# Patient Record
Sex: Male | Born: 1995 | Race: White | Hispanic: No | Marital: Single | Smoking: Never smoker
Health system: Southern US, Community
[De-identification: ages and names within clinical notes are randomized; demographics above are authoritative.]

## PROBLEM LIST (undated history)

## (undated) DIAGNOSIS — Z789 Other specified health status: Secondary | ICD-10-CM

## (undated) HISTORY — PX: ANTERIOR CRUCIATE LIGAMENT REPAIR: SHX115

---
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
## 2015-08-28 DIAGNOSIS — L254 Unspecified contact dermatitis due to food in contact with skin: Secondary | ICD-10-CM | POA: Diagnosis not present

## 2015-08-28 DIAGNOSIS — R0982 Postnasal drip: Secondary | ICD-10-CM | POA: Diagnosis not present

## 2016-03-10 ENCOUNTER — Ambulatory Visit (INDEPENDENT_AMBULATORY_CARE_PROVIDER_SITE_OTHER): Payer: BLUE CROSS/BLUE SHIELD | Admitting: Family Medicine

## 2016-03-10 ENCOUNTER — Encounter: Payer: Self-pay | Admitting: Family Medicine

## 2016-03-10 VITALS — BP 123/73 | HR 62 | Temp 98.0°F | Resp 16

## 2016-03-10 DIAGNOSIS — L089 Local infection of the skin and subcutaneous tissue, unspecified: Secondary | ICD-10-CM

## 2016-03-10 DIAGNOSIS — L309 Dermatitis, unspecified: Secondary | ICD-10-CM

## 2016-03-10 DIAGNOSIS — B360 Pityriasis versicolor: Secondary | ICD-10-CM | POA: Diagnosis not present

## 2016-03-10 MED ORDER — SULFAMETHOXAZOLE-TRIMETHOPRIM 800-160 MG PO TABS: 1.0000 | ORAL_TABLET | Freq: Two times a day (BID) | ORAL | Status: DC

## 2016-03-10 MED ORDER — PREDNISONE 20 MG PO TABS: ORAL_TABLET | ORAL | Status: DC

## 2016-03-10 NOTE — Progress Notes (Signed)
Patient ID: Tony Bowman, male   DOB: 1996-02-10, 20 y.o.   MRN: [id number]  Patient presents today with symptoms of itchy rash on his abdomen. Patient states that he has had this rash for the last 2 weeks or so. He states the area is raised and has not been draining anything. He does admit to doing some yard work a few weeks ago. He denies any fever or chills. He also states that he has had a discolored rash on his upper back for a while. He denies any symptoms with this rash. He also admits to a pimple-like rash on his left lip area. He does have facial hair and does shave over the area. The area hurts at times but is not healing. He denies any known nutritional deficiencies.  Review systems negative except mentioned above. Vitals as per Epic  GENERAL: NAD HEENT: no pharyngeal erythema, no exudate, no erythema of TMs, no cervical LAD RESP: CTA B CARD: RRR SKIN: Hypopigmented rash noted on the upper back, there is a small pimple dry lesion noted along the edge of the left lips, no drainage from the site, there is also a slightly raised erythematous rash on the abdomen primarily on the right side, there is no drainage from the site NEURO: CN II-XII grossly intact   A/P: 1) Tinea Versicolor- [facility] with patient [facility], follow up with me if symptoms persist or worsen 2) Skin Lesion Lip- we'll treat this like an infection with Bactrim DS orally, advised patient to keep the area moist with Vaseline or Aquaphor, I've advised him not to shave over the area until the symptoms are resolved, follow-up if needed 3) Skin Rash Abdomen- given the fact that the rash is itchy and has been there for 2 weeks will treat with oral steroids, oral antihistamine when necessary, seek medical attention if symptoms persist or worsen as discussed.

## 2016-04-29 ENCOUNTER — Ambulatory Visit
Admission: RE | Admit: 2016-04-29 | Discharge: 2016-04-29 | Disposition: A | Discharge status: Home / Self Care | Payer: BLUE CROSS/BLUE SHIELD | Source: Ambulatory Visit | Attending: Family Medicine | Admitting: Family Medicine

## 2016-04-29 ENCOUNTER — Other Ambulatory Visit: Payer: Self-pay | Admitting: Family Medicine

## 2016-04-29 DIAGNOSIS — R609 Edema, unspecified: Secondary | ICD-10-CM

## 2016-04-29 DIAGNOSIS — M25561 Pain in right knee: Secondary | ICD-10-CM | POA: Diagnosis not present

## 2016-04-29 DIAGNOSIS — R52 Pain, unspecified: Secondary | ICD-10-CM

## 2016-04-29 DIAGNOSIS — M25461 Effusion, right knee: Secondary | ICD-10-CM | POA: Diagnosis not present

## 2016-04-29 DIAGNOSIS — S8991XA Unspecified injury of right lower leg, initial encounter: Secondary | ICD-10-CM

## 2016-04-30 ENCOUNTER — Ambulatory Visit
Admission: RE | Admit: 2016-04-30 | Discharge: 2016-04-30 | Disposition: A | Discharge status: Home / Self Care | Payer: BLUE CROSS/BLUE SHIELD | Source: Ambulatory Visit | Attending: Family Medicine | Admitting: Family Medicine

## 2016-04-30 DIAGNOSIS — X58XXXA Exposure to other specified factors, initial encounter: Secondary | ICD-10-CM | POA: Diagnosis not present

## 2016-04-30 DIAGNOSIS — S83511A Sprain of anterior cruciate ligament of right knee, initial encounter: Secondary | ICD-10-CM | POA: Diagnosis not present

## 2016-04-30 DIAGNOSIS — S8991XA Unspecified injury of right lower leg, initial encounter: Secondary | ICD-10-CM | POA: Diagnosis present

## 2016-04-30 DIAGNOSIS — M25461 Effusion, right knee: Secondary | ICD-10-CM | POA: Diagnosis not present

## 2016-04-30 DIAGNOSIS — S83281A Other tear of lateral meniscus, current injury, right knee, initial encounter: Secondary | ICD-10-CM | POA: Insufficient documentation

## 2016-04-30 DIAGNOSIS — S8001XA Contusion of right knee, initial encounter: Secondary | ICD-10-CM | POA: Diagnosis not present

## 2016-07-10 ENCOUNTER — Ambulatory Visit (INDEPENDENT_AMBULATORY_CARE_PROVIDER_SITE_OTHER): Payer: BLUE CROSS/BLUE SHIELD | Admitting: Family Medicine

## 2016-07-10 ENCOUNTER — Encounter: Payer: Self-pay | Admitting: Family Medicine

## 2016-07-10 VITALS — Temp 96.6°F

## 2016-07-10 DIAGNOSIS — Z8719 Personal history of other diseases of the digestive system: Secondary | ICD-10-CM

## 2016-07-10 DIAGNOSIS — K529 Noninfective gastroenteritis and colitis, unspecified: Secondary | ICD-10-CM

## 2016-07-10 MED ORDER — ONDANSETRON 4 MG PO TBDP: 4.0000 mg | ORAL_TABLET | Freq: Three times a day (TID) | ORAL | 0 refills | Status: DC | PRN

## 2016-07-10 NOTE — Progress Notes (Signed)
Patient presents today with symptoms of nausea, vomiting, diarrhea which started around 4 AM this morning. Patient states that he had pizza last night before going right to bed and then woke up and drank some old coffee that had been sitting out for about one day. The cough he had milk and cream in it. He denies any bloody diarrhea, fever. He states that he usually has symptoms of reflux but has never seen a doctor for it. He describes the feeling in his stomach as intermittent tightness. He denies any urinary symptoms. He denies taking any new medications or increased doses of ibuprofen. He does describe some postnasal drip which makes him feel nauseous. Patient also states that he has stomach sensitivity to certain foods that are dairy in nature (cheese, yogurt, etc) but does not have problems with milk. He describes occasionally vomiting with certain foods and also loose bowel movements with them. He does enjoy spicy food and does drink significant caffeinated beverages throughout the day. He oftentimes eats and goes right to bed as well.  ROS: Negative except mentioned above.  Vitals as per Epic.  GENERAL: NAD HEENT: no pharyngeal erythema, no exudate, mild clear mucus noted noted in the posterior pharynx, no erythema of TMs, no cervical LAD RESP: CTA B CARD: RRR ABD: +BS, NT, no rebound or guarding appreciated NEURO: CN II-XII grossly intact   A/P: Gastroenteritis - BRAT diet, rest, hydration, Pepcid or Zantac when necessary, Claritin when necessary for postnasal drip, Zofran when necessary for nausea, patient will seek medical attention if any worsening symptoms, discussed with patient foods and beverages that can cause GERD as well. I have recommended that he follow-up with GI over break given his chronic symptoms.

## 2016-10-09 ENCOUNTER — Encounter: Payer: Self-pay | Admitting: Family Medicine

## 2016-10-09 ENCOUNTER — Ambulatory Visit (INDEPENDENT_AMBULATORY_CARE_PROVIDER_SITE_OTHER): Payer: BLUE CROSS/BLUE SHIELD | Admitting: Family Medicine

## 2016-10-09 VITALS — BP 133/91 | HR 88 | Temp 98.4°F | Resp 14

## 2016-10-09 DIAGNOSIS — J069 Acute upper respiratory infection, unspecified: Secondary | ICD-10-CM

## 2016-10-09 MED ORDER — AZITHROMYCIN 250 MG PO TABS: ORAL_TABLET | ORAL | 0 refills | Status: DC

## 2016-10-09 NOTE — Progress Notes (Signed)
Patient presents today with symptoms of sore throat in the mornings, nasal drainage, cough, headache. Patient denies any abdominal pain, nausea, vomiting, chest pain or shortness of breath. He denies any fever, myalgias, night sweats. Has not taken any medications today.  ROS: Negative except mentioned above. Vitals as per Epic. GENERAL: NAD HEENT: mild pharyngeal erythema, no exudate, no erythema of TMs, no cervical LAD RESP: CTA B CARD: RRR NEURO: CN II-XII grossly intact   A/P: URI - not suggestive of the flu given vitals and symptoms, will treat with Z-Pak, Claritin when necessary, Delsym when necessary, Tylenol/Ibuprofen when necessary, rest, hydration, seek medical attention if symptoms persist or worsen as discussed. No athletic activity or class if febrile.

## 2017-03-13 ENCOUNTER — Encounter: Payer: Self-pay | Admitting: Family Medicine

## 2017-03-13 ENCOUNTER — Ambulatory Visit (INDEPENDENT_AMBULATORY_CARE_PROVIDER_SITE_OTHER): Payer: Self-pay | Admitting: Family Medicine

## 2017-03-13 VITALS — BP 122/77 | HR 68 | Temp 97.7°F | Resp 14

## 2017-03-13 DIAGNOSIS — K219 Gastro-esophageal reflux disease without esophagitis: Secondary | ICD-10-CM

## 2017-03-13 MED ORDER — OMEPRAZOLE 40 MG PO CPDR
40.0000 mg | DELAYED_RELEASE_CAPSULE | Freq: Every day | ORAL | 0 refills | Status: DC
Start: 2017-03-13 — End: 2017-04-11

## 2017-03-13 NOTE — Progress Notes (Signed)
Patient presents today with chronic symptoms of epigastric discomfort, excessive burping, heartburn sensation. Patient states that he has tried Tums and Gas-X in the past which has helped some. He also has tried changing his diet which has also helped some. His symptoms primarily worse in the morning when he does not eat anything and does physical activity or goes to class fasting. He denies any problems with constipation or diarrhea. He denies any significant weight loss. He denies any bright red blood per rectum or melena. He denies any excessive alcohol use or cigarette smoking. He tends to have 2-3 hours between his last meal and going to sleep. He does not restrict anything particular in his diet except for whole milk. He tends to eat white bread instead of weeks. He is not a vegetarian. He does not have excessive amounts of caffeine. He denies taking any medications on a regular basis or supplements.  ROS: Negative except mentioned above.  Vitals as per Epic. GENERAL: NAD HEENT: no pharyngeal erythema, no exudate, no erythema of TMs, no cervical LAD RESP: CTA B CARD: RRR ABD: Positive bowel sounds, nontender, no organomegaly appreciated, no flank tenderness NEURO: CN II-XII grossly intact   A/P: Gastroesophageal reflux, possible ulcer - will treat with omeprazole for 2 weeks, discussed with patient to try to eat meals earlier and to avoid greasy, spicy foods at night and also to avoid caffeine and alcohol at night before bed. Also encourage patient not to skip meals in the morning. Drink plenty of water. Seek medical attention if symptoms persist or worsen. Would recommend evaluation by GI if no significant improvement with omeprazole and lifestyle changes.

## 2017-04-11 ENCOUNTER — Emergency Department
Admission: EM | Admit: 2017-04-11 | Discharge: 2017-04-11 | Disposition: A | Discharge status: Home / Self Care | Payer: 59 | Attending: Emergency Medicine | Admitting: Emergency Medicine

## 2017-04-11 ENCOUNTER — Emergency Department: Payer: 59

## 2017-04-11 ENCOUNTER — Encounter: Payer: Self-pay | Admitting: Emergency Medicine

## 2017-04-11 DIAGNOSIS — S022XXA Fracture of nasal bones, initial encounter for closed fracture: Secondary | ICD-10-CM | POA: Insufficient documentation

## 2017-04-11 DIAGNOSIS — W501XXA Accidental kick by another person, initial encounter: Secondary | ICD-10-CM | POA: Insufficient documentation

## 2017-04-11 DIAGNOSIS — Y92322 Soccer field as the place of occurrence of the external cause: Secondary | ICD-10-CM | POA: Diagnosis not present

## 2017-04-11 DIAGNOSIS — S0992XA Unspecified injury of nose, initial encounter: Secondary | ICD-10-CM | POA: Diagnosis present

## 2017-04-11 DIAGNOSIS — Y9366 Activity, soccer: Secondary | ICD-10-CM | POA: Insufficient documentation

## 2017-04-11 DIAGNOSIS — Y999 Unspecified external cause status: Secondary | ICD-10-CM | POA: Insufficient documentation

## 2017-04-11 MED ORDER — ACETAMINOPHEN-CODEINE #2 300-15 MG PO TABS: 1.0000 | ORAL_TABLET | ORAL | 0 refills | Status: AC | PRN

## 2017-04-11 NOTE — Discharge Instructions (Signed)
Sleep with head elevated, sleeping in a recliner would be ideal. Do not lie or sleep flat.   Reduce activities that would increase your blood pressure in order to minimize swelling. Minimize standing and walking activities.  Do not participate with soccer activities until cleared by ENT.  You may take Tylenol for pain. Please avoid ibuprofen, Motrin, Aleve or Advil.  Dr. Elenore Rota would like for you to schedule an appointment to be seen Tuesday, Apr 14, 2017 at his office.

## 2017-04-11 NOTE — ED Triage Notes (Signed)
Pt reports injury during soccer game at [facility]; pt says he "took a shin" to the bridge of his nose; deviation to the right noted along with swelling and bruising; small laceration present; pt denies loss of consciousness; denies headache, denies any pain; pt ambulatory with steady gait

## 2017-04-11 NOTE — ED Notes (Signed)
Patient transported to CT 

## 2017-04-11 NOTE — ED Provider Notes (Signed)
[facility] Emergency Department Provider Note   ____________________________________________   I have reviewed the triage vital signs and the nursing notes.   HISTORY  Chief Complaint Facial Injury    HPI Tony Bowman is a 21 y.o. male presents to emergency department with nasal injury he sustained while playing soccer earlier this evening. 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 Patient noted epistaxis and swelling medially following the injury. Patient noted deformity of his nose swelling and bruising. Patient denies breathing difficulty or any other airway issues.He denies loss of consciousness and recalls the event Patient denies head, neck or back pain at this time. Patient denies fever, chills, headache, vision changes, chest pain, chest tightness, shortness of breath, abdominal pain, nausea and vomiting.  History reviewed. No pertinent past medical history.  There are no active problems to display for this patient.   Past Surgical History:  Procedure Laterality Date  . ANTERIOR CRUCIATE LIGAMENT REPAIR Right     Prior to Admission medications   Medication Sig Start Date End Date Taking? Authorizing Provider  acetaminophen-codeine (TYLENOL #2) 300-15 MG tablet Take 1 tablet by mouth every 4 (four) hours as needed for moderate pain. 04/11/17   Bunny Lowdermilk M, PA-C    Allergies Patient has no known allergies.  Family History  Problem Relation Age of Onset  . Hyperlipidemia Father   . Hypertension Father   . Heart disease Paternal Uncle   . Cancer Paternal Grandmother   . Cancer Paternal Grandfather     Social History Social History  Substance Use Topics  . Smoking status: Never Smoker  . Smokeless tobacco: Never Used  . Alcohol use Yes    Review of Systems Constitutional: Negative for fever/chills Eyes: No visual changes. ENT:  Negative for sore throat and for difficulty  swallowing Cardiovascular: Denies chest pain. Respiratory: Denies cough. Denies shortness of breath. Gastrointestinal: No abdominal pain.  No nausea, vomiting, diarrhea. Genitourinary: Negative for dysuria. Musculoskeletal: Positive for nasal pain with epistaxis, edema and  Deformity. Skin: Negative for rash. Neurological: Negative for headaches.  Negative focal weakness or numbness. Negative for loss of consciousness. Able to ambulate. ____________________________________________   PHYSICAL EXAM:  VITAL SIGNS: ED Triage Vitals  Enc Vitals Group     BP 04/11/17 2104 115/79     Pulse Rate 04/11/17 2104 83     Resp 04/11/17 2104 18     Temp 04/11/17 2104 98.2 F (36.8 C)     Temp Source 04/11/17 2104 Oral     SpO2 04/11/17 2104 99 %     Weight 04/11/17 2106 160 lb (72.6 kg)     Height 04/11/17 2106 5' 7.5" (1.715 m)     Head Circumference --      Peak Flow --      Pain Score 04/11/17 2104 0     Pain Loc --      Pain Edu? --      Excl. in GC? --     Constitutional: Alert and oriented. Well appearing and in no acute distress.  Eyes: Conjunctivae are normal. PERRL. EOMI  Head: Normocephalic and atraumatic. ENT:      Ears: Canals clear. TMs intact bilaterally.      Nose: Deformity with edema and ecchymosis. No epistaxis at the time of evaluation. Dried blood around the nares and small abrasion along the bridge of the nose.       Mouth/Throat: Mucous membranes are  moist. Oropharynx nonedematous. Neck:Supple. No thyromegaly. No stridor.  Cardiovascular: Normal rate, regular rhythm. Normal S1 and S2.  Good peripheral circulation. Respiratory: Normal respiratory effort without tachypnea or retractions. Lungs CTAB. No wheezes/rales/rhonchi. Good air entry to the bases with no decreased or absent breath sounds. Hematological/Lymphatic/Immunological: No cervical lymphadenopathy. Cardiovascular: Normal rate, regular rhythm. Normal distal pulses. Gastrointestinal: Bowel sounds 4  quadrants. Soft and nontender to palpation. No guarding or rigidity. No palpable masses. No distention. No CVA tenderness. Musculoskeletal: Nontender with normal range of motion in all extremities. Neurologic: Normal speech and language. No gross focal neurologic deficits are appreciated. No gait instability. Cranial nerves: II-X intact. No sensory loss or abnormal reflexes.  Skin:  Skin is warm, dry and intact. No rash noted. Psychiatric: Mood and affect are normal. Speech and behavior are normal. Patient exhibits appropriate insight and judgement.  ____________________________________________   LABS (all labs ordered are listed, but only abnormal results are displayed)  Labs Reviewed - No data to display ____________________________________________  EKG None ____________________________________________  RADIOLOGY CT max of facial without contrast ____________________________________________   PROCEDURES  Procedure(s) performed: no    Critical Care performed: no ____________________________________________   INITIAL IMPRESSION / ASSESSMENT AND PLAN / ED COURSE  Pertinent labs & imaging results that were available during my care of the patient were reviewed by me and considered in my medical decision making (see chart for details).  Patient presents to emergency department with nasal pain, epistaxis and deformity after being hit in the nose during a soccer game earlier tonight. Patient history, physical exam and imaging findings are consistent with acute slightly comminuted and displaced bilateral nasal bone. Dr. Elenore Rota with ENT was contacted regarding fracture and he advised patient will need a follow-up in his office on Tuesday. He also instructed patient to refrain from all exertional activities and soccer until cleared by ENT. Recommendations given to patient are as follows: Sleep with head elevated, no exertional activities to increase blood pressure, no participation with  soccer activities, apply cold packs to reduce swelling of the nose, manage pain with Tylenol and refrain from taking NSAIDs. Patient given prescription for Tylenol with Codeine for severe pain. Patient was advised to follow up with ENT for continued care and was also advised to return to the emergency department for symptoms that change or worsen.  ____________________________________________   FINAL CLINICAL IMPRESSION(S) / ED DIAGNOSES  Final diagnoses:  Closed fracture of nasal bone, initial encounter       NEW MEDICATIONS STARTED DURING THIS VISIT:  Discharge Medication List as of 04/11/2017 10:14 PM    START taking these medications   Details  acetaminophen-codeine (TYLENOL #2) 300-15 MG tablet Take 1 tablet by mouth every 4 (four) hours as needed for moderate pain., Starting Sat 04/11/2017, Print         Note:  This document was prepared using Dragon voice recognition software and may include unintentional dictation errors.    Clois Comber, PA-C 04/11/17 2359    Jene Every, MD 04/12/17 [phone]

## 2017-04-14 ENCOUNTER — Encounter: Payer: Self-pay | Admitting: *Deleted

## 2017-04-16 ENCOUNTER — Ambulatory Visit: Payer: 59 | Admitting: Anesthesiology

## 2017-04-16 ENCOUNTER — Encounter
Admission: RE | Disposition: A | Discharge status: Home / Self Care | Payer: Self-pay | Source: Ambulatory Visit | Attending: Otolaryngology

## 2017-04-16 ENCOUNTER — Ambulatory Visit
Admission: RE | Admit: 2017-04-16 | Discharge: 2017-04-16 | Disposition: A | Discharge status: Home / Self Care | Payer: 59 | Source: Ambulatory Visit | Attending: Otolaryngology | Admitting: Otolaryngology

## 2017-04-16 DIAGNOSIS — W500XXA Accidental hit or strike by another person, initial encounter: Secondary | ICD-10-CM | POA: Insufficient documentation

## 2017-04-16 DIAGNOSIS — S022XXA Fracture of nasal bones, initial encounter for closed fracture: Secondary | ICD-10-CM | POA: Diagnosis not present

## 2017-04-16 DIAGNOSIS — K219 Gastro-esophageal reflux disease without esophagitis: Secondary | ICD-10-CM | POA: Diagnosis not present

## 2017-04-16 HISTORY — DX: Other specified health status: Z78.9

## 2017-04-16 HISTORY — PX: CLOSED REDUCTION NASAL FRACTURE: SHX5365

## 2017-04-16 SURGERY — CLOSED REDUCTION, FRACTURE, NASAL BONE
Anesthesia: General

## 2017-04-16 MED ORDER — PROMETHAZINE HCL 25 MG/ML IJ SOLN: 6.2500 mg | INTRAMUSCULAR | Status: DC | PRN

## 2017-04-16 MED ORDER — LIDOCAINE HCL (CARDIAC) 20 MG/ML IV SOLN
INTRAVENOUS | Status: DC | PRN
  Administered 2017-04-16: 50 mg via INTRATRACHEAL

## 2017-04-16 MED ORDER — ACETAMINOPHEN 10 MG/ML IV SOLN
1000.0000 mg | Freq: Once | INTRAVENOUS | Status: AC
  Administered 2017-04-16: 1000 mg via INTRAVENOUS

## 2017-04-16 MED ORDER — LACTATED RINGERS IV SOLN
10.0000 mL/h | INTRAVENOUS | Status: DC
  Administered 2017-04-16: 10 mL/h via INTRAVENOUS

## 2017-04-16 MED ORDER — MIDAZOLAM HCL 5 MG/5ML IJ SOLN
INTRAMUSCULAR | Status: DC | PRN
  Administered 2017-04-16: 2 mg via INTRAVENOUS

## 2017-04-16 MED ORDER — FENTANYL CITRATE (PF) 100 MCG/2ML IJ SOLN
INTRAMUSCULAR | Status: DC | PRN
  Administered 2017-04-16: 100 ug via INTRAVENOUS

## 2017-04-16 MED ORDER — OXYCODONE HCL 5 MG PO TABS
5.0000 mg | ORAL_TABLET | Freq: Once | ORAL | Status: AC
  Administered 2017-04-16: 5 mg via ORAL

## 2017-04-16 MED ORDER — FENTANYL CITRATE (PF) 100 MCG/2ML IJ SOLN: 25.0000 ug | INTRAMUSCULAR | Status: DC | PRN

## 2017-04-16 MED ORDER — PHENYLEPHRINE HCL 0.5 % NA SOLN
NASAL | Status: DC | PRN
  Administered 2017-04-16: 10 mL via NASAL

## 2017-04-16 MED ORDER — PROPOFOL 10 MG/ML IV BOLUS
INTRAVENOUS | Status: DC | PRN
  Administered 2017-04-16: 50 mg via INTRAVENOUS
  Administered 2017-04-16: 100 mg via INTRAVENOUS

## 2017-04-16 MED ORDER — DEXAMETHASONE SODIUM PHOSPHATE 4 MG/ML IJ SOLN
INTRAMUSCULAR | Status: DC | PRN
  Administered 2017-04-16: 8 mg via INTRAVENOUS

## 2017-04-16 MED ORDER — MEPERIDINE HCL 25 MG/ML IJ SOLN: 6.2500 mg | INTRAMUSCULAR | Status: DC | PRN

## 2017-04-16 MED ORDER — ONDANSETRON HCL 4 MG/2ML IJ SOLN
INTRAMUSCULAR | Status: DC | PRN
  Administered 2017-04-16: 4 mg via INTRAVENOUS

## 2017-04-16 SURGICAL SUPPLY — 18 items
ADHESIVE MASTISOL STRL (MISCELLANEOUS) IMPLANT
BASIN GRAD PLASTIC 32OZ STRL (MISCELLANEOUS) ×3 IMPLANT
CANISTER SUCT 1200ML W/VALVE (MISCELLANEOUS) ×3 IMPLANT
CLOSURE WOUND 1/2 X4 (GAUZE/BANDAGES/DRESSINGS) ×1
COVER MAYO STAND STRL (DRAPES) ×3 IMPLANT
CUP MEDICINE 2OZ PLAST GRAD ST (MISCELLANEOUS) ×3 IMPLANT
GAUZE PETROLATUM 1 X8 (GAUZE/BANDAGES/DRESSINGS) IMPLANT
GAUZE SPONGE 4X4 12PLY STRL (GAUZE/BANDAGES/DRESSINGS) ×3 IMPLANT
GLOVE PI ULTRA LF STRL 7.5 (GLOVE) ×1 IMPLANT
GLOVE PI ULTRA NON LATEX 7.5 (GLOVE) ×2
KIT ROOM TURNOVER OR (KITS) ×3 IMPLANT
PAD ALCOHOL SWAB (MISCELLANEOUS) ×3 IMPLANT
SPONGE NEURO XRAY DETECT 1X3 (DISPOSABLE) ×3 IMPLANT
STRAP BODY AND KNEE 60X3 (MISCELLANEOUS) ×3 IMPLANT
STRIP CLOSURE SKIN 1/2X4 (GAUZE/BANDAGES/DRESSINGS) ×2 IMPLANT
TOWEL OR 17X26 4PK STRL BLUE (TOWEL DISPOSABLE) ×3 IMPLANT
TUBING CONN 6MMX3.1M (TUBING) ×2
TUBING SUCTION CONN 0.25 STRL (TUBING) ×1 IMPLANT

## 2017-04-16 NOTE — Anesthesia Preprocedure Evaluation (Addendum)
Anesthesia Evaluation  Patient identified by MRN, date of birth, ID band Patient awake    Reviewed: Allergy & Precautions, NPO status , Patient's Chart, lab work & pertinent test results  History of Anesthesia Complications Negative for: history of anesthetic complications  Airway Mallampati: I  TM Distance: >3 FB Neck ROM: Full    Dental  (+)    Pulmonary neg pulmonary ROS,    Pulmonary exam normal breath sounds clear to auscultation       Cardiovascular Exercise Tolerance: Good negative cardio ROS Normal cardiovascular exam Rhythm:Regular Rate:Normal     Neuro/Psych negative neurological ROS     GI/Hepatic GERD  ,  Endo/Other  negative endocrine ROS  Renal/GU negative Renal ROS     Musculoskeletal   Abdominal   Peds  Hematology negative hematology ROS (+)   Anesthesia Other Findings   Reproductive/Obstetrics                             Anesthesia Physical Anesthesia Plan  ASA: I  Anesthesia Plan: General   Post-op Pain Management:    Induction: Intravenous  PONV Risk Score and Plan: 1 and Ondansetron and Dexamethasone  Airway Management Planned: LMA  Additional Equipment:   Intra-op Plan:   Post-operative Plan: Extubation in OR  Informed Consent: I have reviewed the patients History and Physical, chart, labs and discussed the procedure including the risks, benefits and alternatives for the proposed anesthesia with the patient or authorized representative who has indicated his/her understanding and acceptance.     Plan Discussed with: CRNA  Anesthesia Plan Comments:        Anesthesia Quick Evaluation

## 2017-04-16 NOTE — Anesthesia Procedure Notes (Signed)
Procedure Name: LMA Insertion Date/Time: 04/16/2017 9:19 AM Performed by: Andee Poles Pre-anesthesia Checklist: Patient identified, Emergency Drugs available, Suction available, Timeout performed and Patient being monitored Patient Re-evaluated:Patient Re-evaluated prior to induction Oxygen Delivery Method: Circle system utilized Preoxygenation: Pre-oxygenation with 100% oxygen Induction Type: IV induction LMA: LMA inserted LMA Size: 4.0 Number of attempts: 1 Placement Confirmation: positive ETCO2 and breath sounds checked- equal and bilateral Tube secured with: Tape

## 2017-04-16 NOTE — Discharge Instructions (Signed)
General Anesthesia, Adult, Care After °These instructions provide you with information about caring for yourself after your procedure. Your health care provider may also give you more specific instructions. Your treatment has been planned according to current medical practices, but problems sometimes occur. Call your health care provider if you have any problems or questions after your procedure. °What can I expect after the procedure? °After the procedure, it is common to have: °· Vomiting. °· A sore throat. °· Mental slowness. ° °It is common to feel: °· Nauseous. °· Cold or shivery. °· Sleepy. °· Tired. °· Sore or achy, even in parts of your body where you did not have surgery. ° °Follow these instructions at home: °For at least 24 hours after the procedure: °· Do not: °? Participate in activities where you could fall or become injured. °? Drive. °? Use heavy machinery. °? Drink alcohol. °? Take sleeping pills or medicines that cause drowsiness. °? Make important decisions or sign legal documents. °? Take care of children on your own. °· Rest. °Eating and drinking °· If you vomit, drink water, juice, or soup when you can drink without vomiting. °· Drink enough fluid to keep your urine clear or pale yellow. °· Make sure you have little or no nausea before eating solid foods. °· Follow the diet recommended by your health care provider. °General instructions °· Have a responsible adult stay with you until you are awake and alert. °· Return to your normal activities as told by your health care provider. Ask your health care provider what activities are safe for you. °· Take over-the-counter and prescription medicines only as told by your health care provider. °· If you smoke, do not smoke without supervision. °· Keep all follow-up visits as told by your health care provider. This is important. °Contact a health care provider if: °· You continue to have nausea or vomiting at home, and medicines are not helpful. °· You  cannot drink fluids or start eating again. °· You cannot urinate after 8-12 hours. °· You develop a skin rash. °· You have fever. °· You have increasing redness at the site of your procedure. °Get help right away if: °· You have difficulty breathing. °· You have chest pain. °· You have unexpected bleeding. °· You feel that you are having a life-threatening or urgent problem. °This information is not intended to replace advice given to you by your health care provider. Make sure you discuss any questions you have with your health care provider. °Document Released: 11/17/2000 Document Revised: 01/14/2016 Document Reviewed: 07/26/2015 °Elsevier Interactive Patient Education © 2018 [employer] ° °

## 2017-04-16 NOTE — Anesthesia Postprocedure Evaluation (Signed)
Anesthesia Post Note  Patient: Tony Bowman  Procedure(s) Performed: Procedure(s) (LRB): CLOSED REDUCTION NASAL FRACTURE (N/A)  Patient location during evaluation: PACU Anesthesia Type: General Level of consciousness: awake and alert, oriented and patient cooperative Pain management: pain level controlled Vital Signs Assessment: post-procedure vital signs reviewed and stable Respiratory status: spontaneous breathing, nonlabored ventilation and respiratory function stable Cardiovascular status: blood pressure returned to baseline and stable Postop Assessment: adequate PO intake Anesthetic complications: no    Reed Breech

## 2017-04-16 NOTE — Transfer of Care (Signed)
Immediate Anesthesia Transfer of Care Note  Patient: Tony Bowman  Procedure(s) Performed: Procedure(s): CLOSED REDUCTION NASAL FRACTURE (N/A)  Patient Location: PACU  Anesthesia Type: General  Level of Consciousness: awake, alert  and patient cooperative  Airway and Oxygen Therapy: Patient Spontanous Breathing and Patient connected to supplemental oxygen  Post-op Assessment: Post-op Vital signs reviewed, Patient's Cardiovascular Status Stable, Respiratory Function Stable, Patent Airway and No signs of Nausea or vomiting  Post-op Vital Signs: Reviewed and stable  Complications: No apparent anesthesia complications

## 2017-04-16 NOTE — H&P (Signed)
H&P has been reviewedand patient reevaluated,  and no changes necessary. To be downloaded later.  

## 2017-04-16 NOTE — Op Note (Signed)
04/16/2017  9:37 AM    Tony Bowman  [id number]   Pre-Op Dx:  Displaced nasal fracture  Post-op Dx: Displaced nasal fracture  Proc: Closed reduction nasal fracture   Surg:  Lesli Issa H  Anes:  GOT  EBL:  None  Comp:  None  Findings:  Nasal bones pushed to the right side with trauma. His nose looks straight post reduction  Procedure: The patient was given general anesthesia by laryngeal mask anesthesia. His nose prepped using cottonoid pledgets soaked in 4% lidocaine mixed with Afrin. There was very obvious to see his nose deviated towards the right side and he could feel the depression of left nasal bone and outward fracture of the right nasal bone.  The cotton pledges were removed. A [place] elevator was placed inside the nostril to elevate the left nasal bone. Digital pressure was used right nasal bone to fracture it back into the midline. You could hear the crack as the bones moved over, and then the nose was sitting in the midline. Steri-Strips were placed over the nasal dorsum followed by an Aquaplast cast. He has small scab of the nasal dorsum that was covered over by Steri-Strips. There was no evidence for any infection.  Dispo:   To PACU to be discharged home  Plan:  To follow-up in the office in 1 week for cast and Steri-Strip removal. He is to rest at home and take it easy and not play any soccer for 2 weeks.  [place]  04/16/2017 9:37 AM

## 2017-04-17 ENCOUNTER — Encounter: Payer: Self-pay | Admitting: Otolaryngology

## 2017-05-18 IMAGING — CR DG KNEE COMPLETE 4+V*R*
1 series · 4 of 4 positions shown · non-contrast
Comparison: None.

CLINICAL DATA: Pain and swelling. Injury to right knee while
playing soccer.

EXAM:
RIGHT KNEE - COMPLETE 4+ VIEW

[Series 1: dg knee 4 v w/ sunrise/patella right · 0.14mm/px · 4 of 4 slices shown]
[im 1/4]
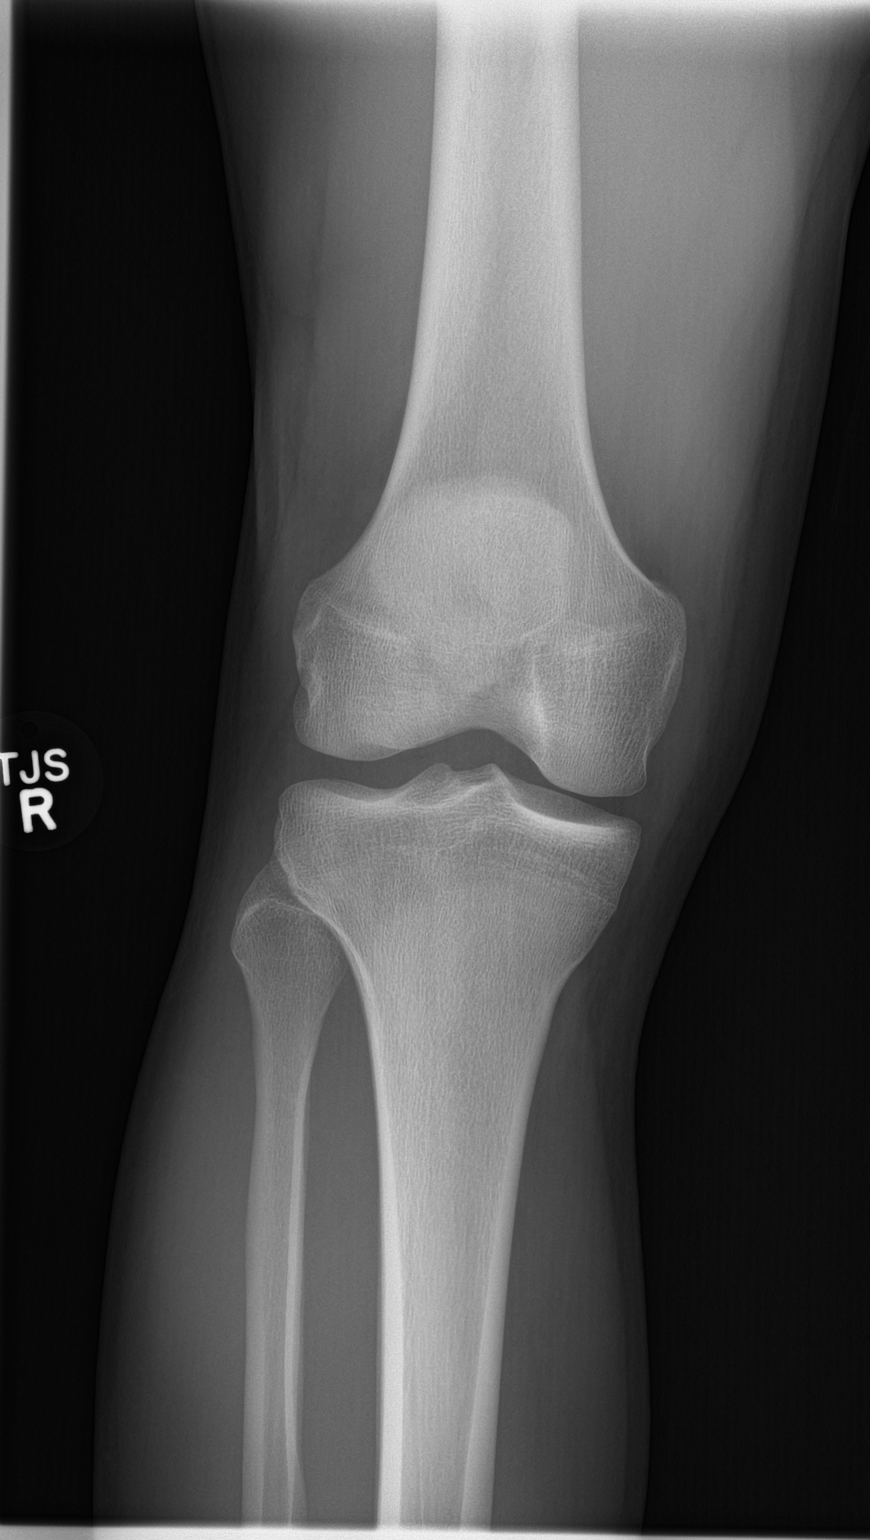
[im 2/4]
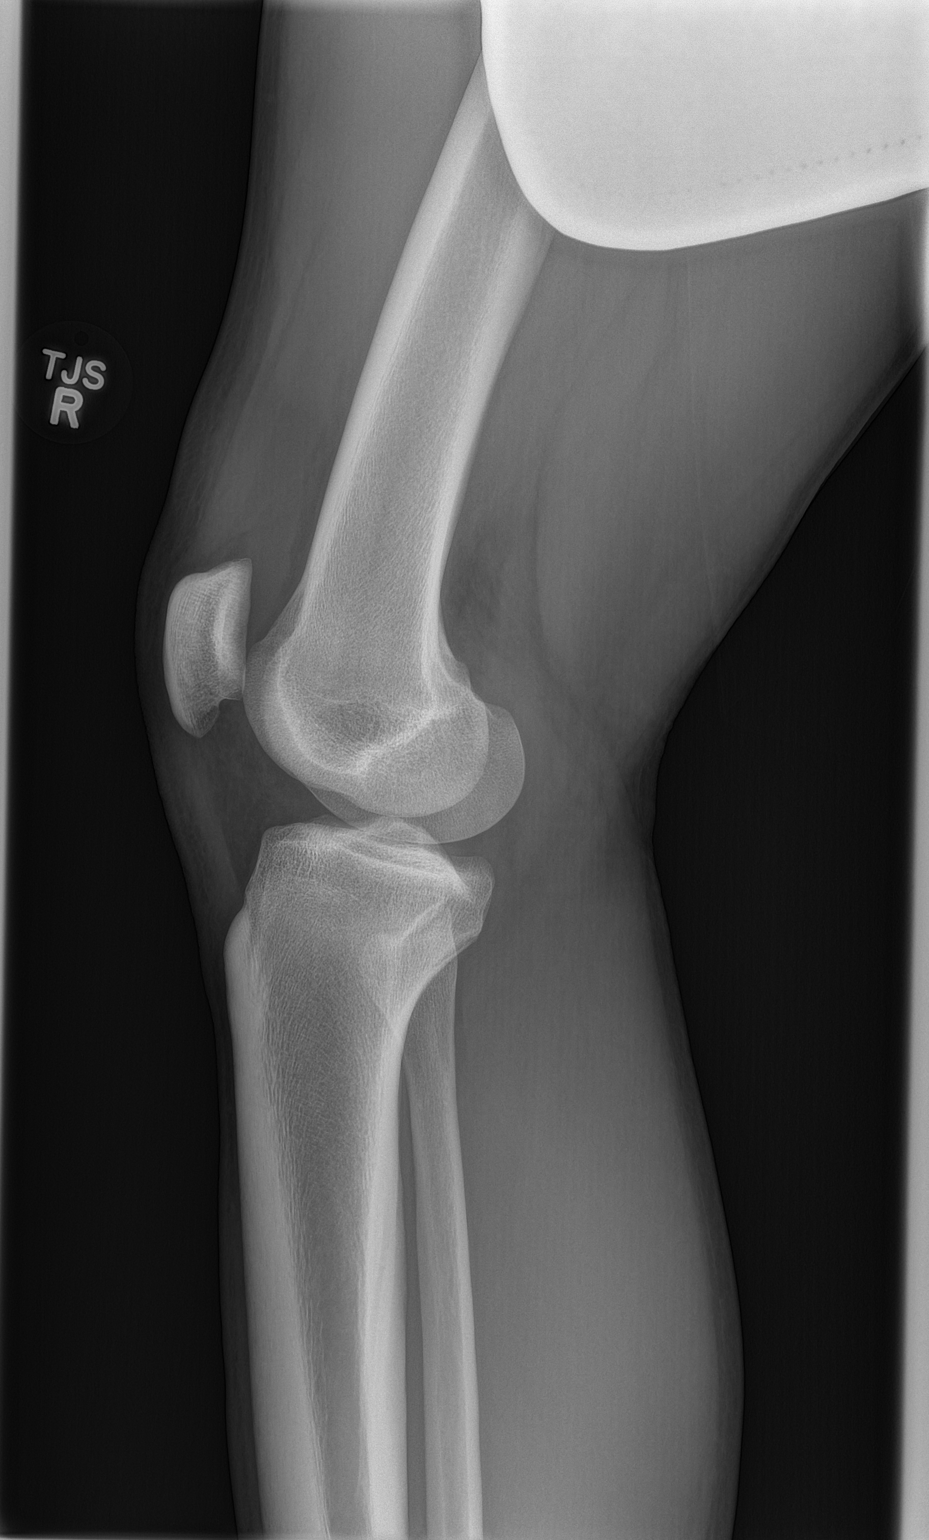
[im 3/4]
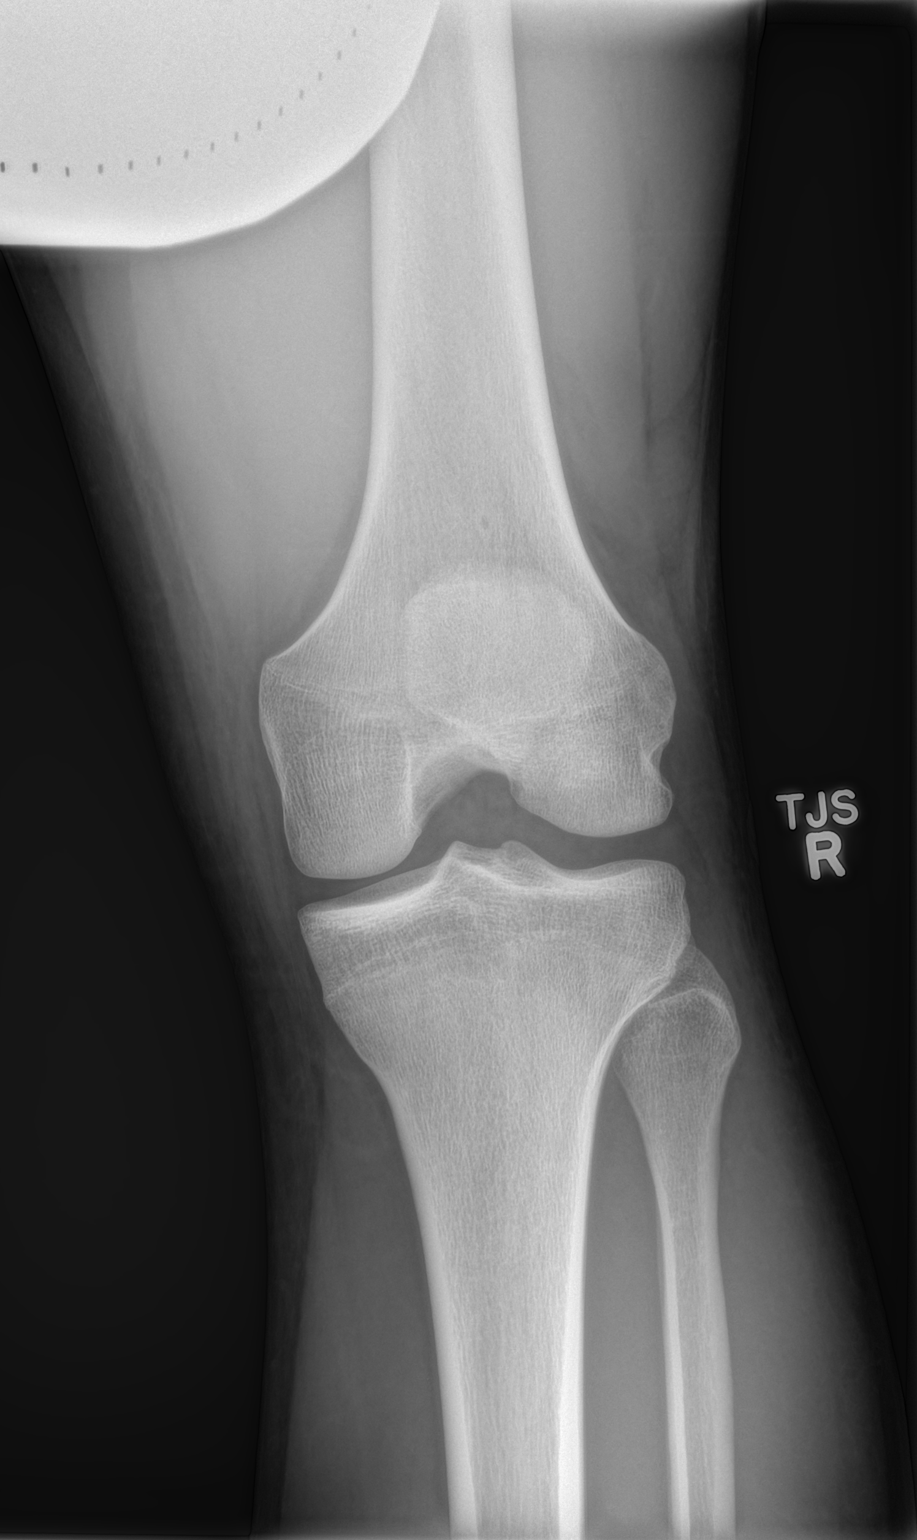
[im 4/4]
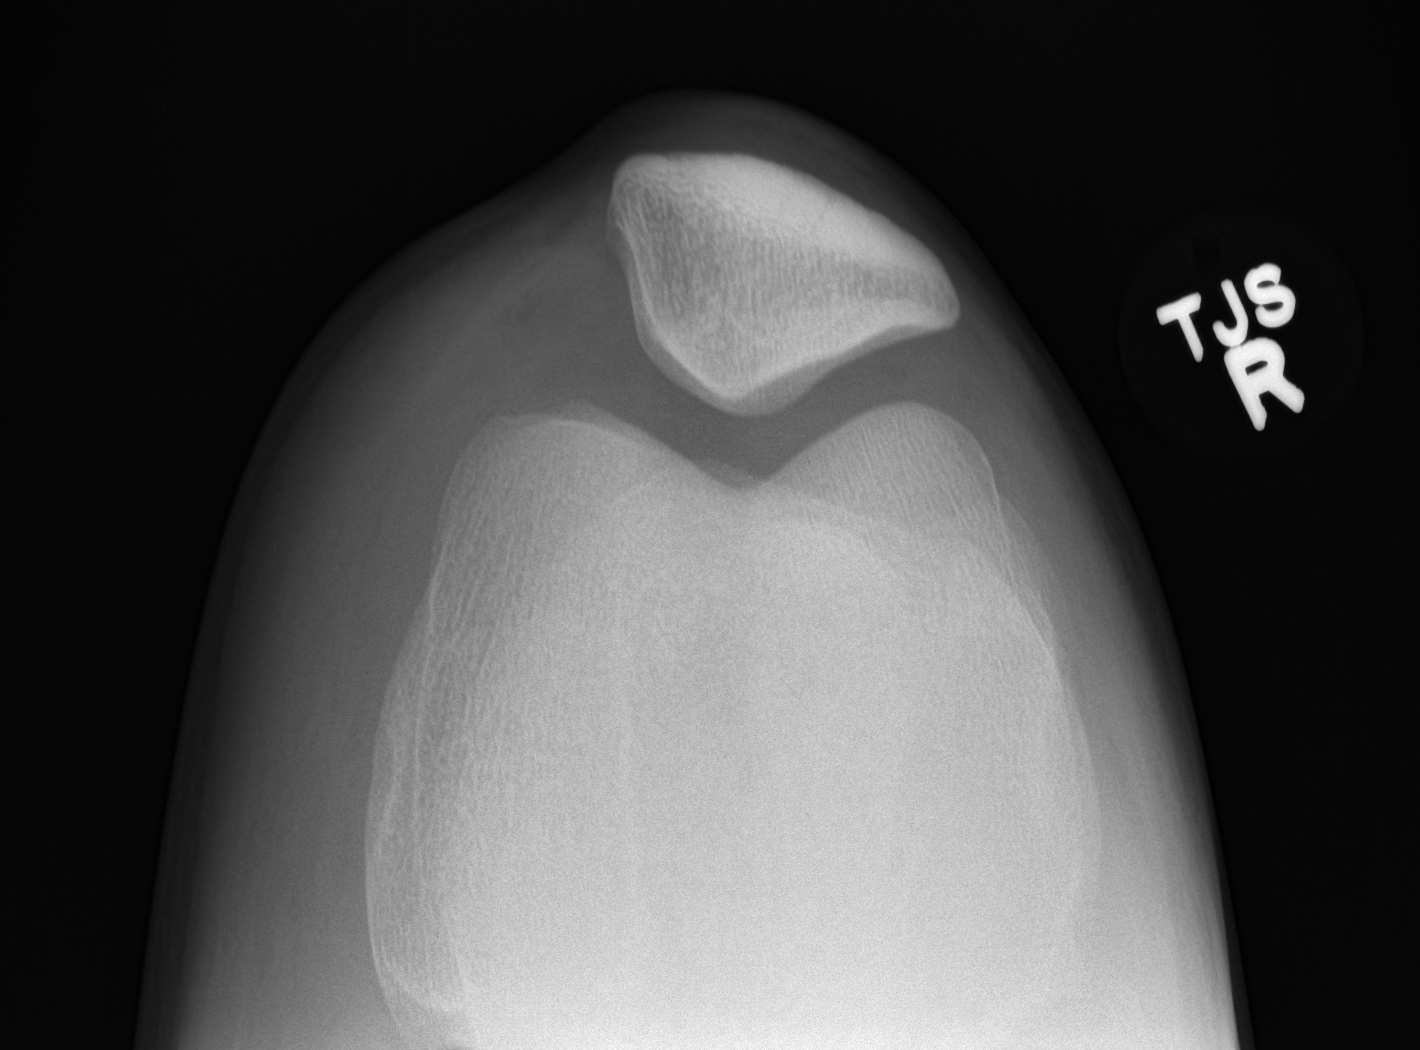

[4 of 4 positions shown; findings below may reference images not displayed]

FINDINGS: There is a moderate-sized suprapatellar joint effusion. The knee is
aligned. The patella is well-positioned, as demonstrated on the
sunrise view.

The bones are intact. No evidence of fracture. The joint spaces are
maintained, no degenerative changes apparent. No discrete soft
tissue swelling.
IMPRESSION: Suprapatellar joint effusion.

No acute bony abnormality.

## 2018-04-29 ENCOUNTER — Encounter: Payer: Self-pay | Admitting: Family Medicine

## 2018-04-29 ENCOUNTER — Ambulatory Visit (INDEPENDENT_AMBULATORY_CARE_PROVIDER_SITE_OTHER): Payer: 59 | Admitting: Family Medicine

## 2018-04-29 VITALS — BP 127/84 | HR 76 | Temp 97.1°F | Resp 14

## 2018-04-29 DIAGNOSIS — R0981 Nasal congestion: Secondary | ICD-10-CM

## 2018-04-30 IMAGING — CT CT MAXILLOFACIAL W/O CM
3 series · 16 of 47 positions shown, 19 images · non-contrast
Comparison: None.

CLINICAL DATA: Nasal trauma, shin to the nose with deviation and
swelling

EXAM:
CT MAXILLOFACIAL WITHOUT CONTRAST
TECHNIQUE: Multidetector CT imaging of the maxillofacial structures was
performed. Multiplanar CT image reconstructions were also generated.

[Series 2: max soft · axial · 0.33mm/px · z∈[+174,+312]mm · 10 of 81 slices shown, 13 images]
[im 6/81  brain]
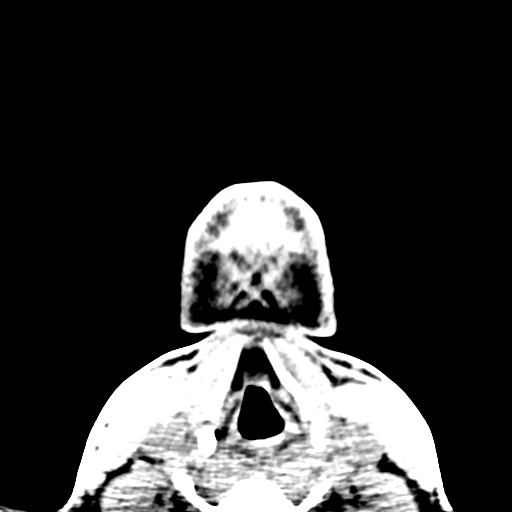
[im 6/81  bone]
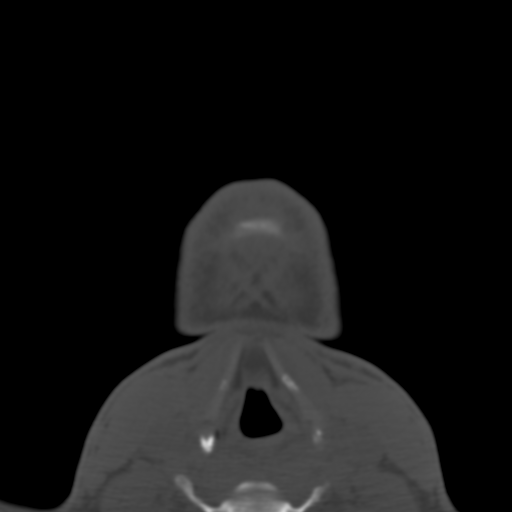
[im 14/81  bone]
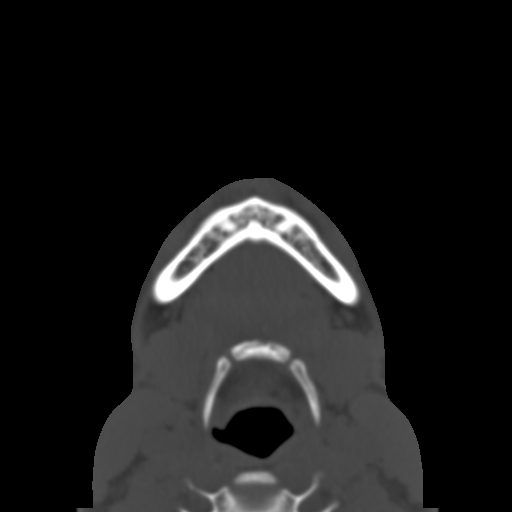
[im 23/81  bone]
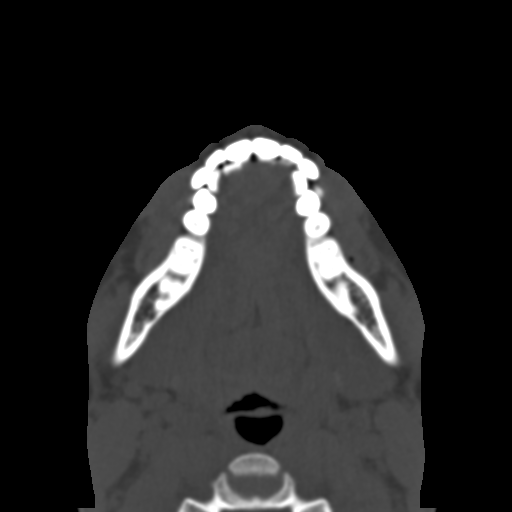
[im 28/81  bone]
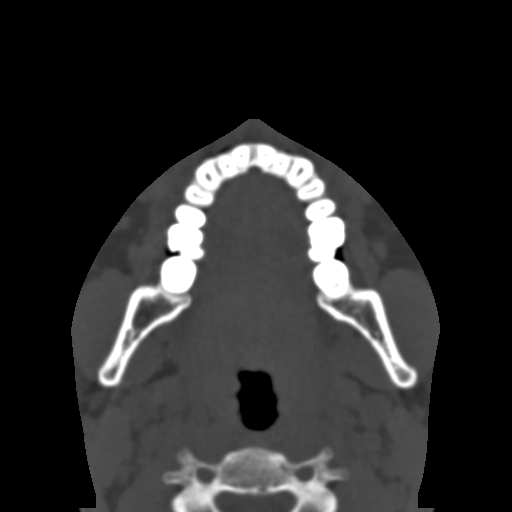
[im 36/81  brain]
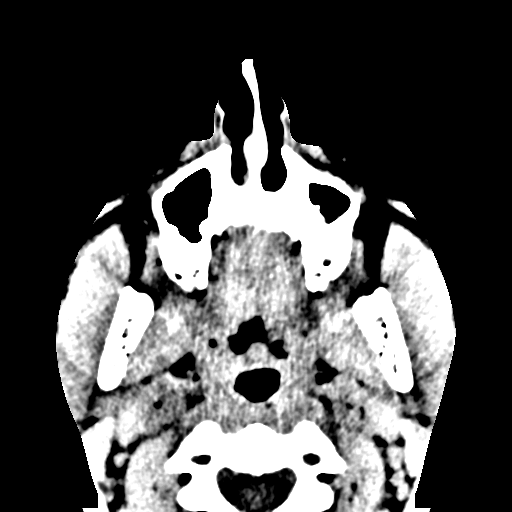
[im 36/81  bone]
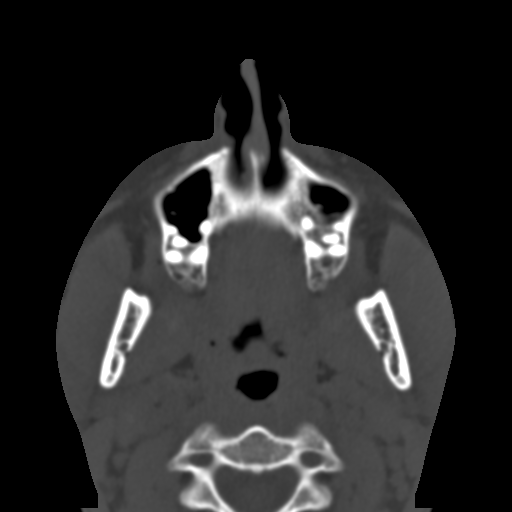
[im 45/81  bone]
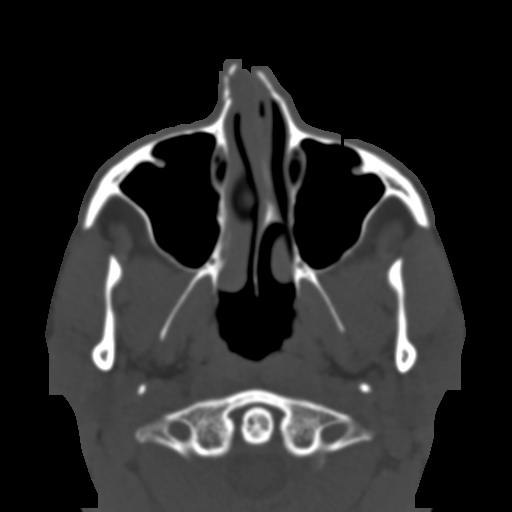
[im 53/81  bone]
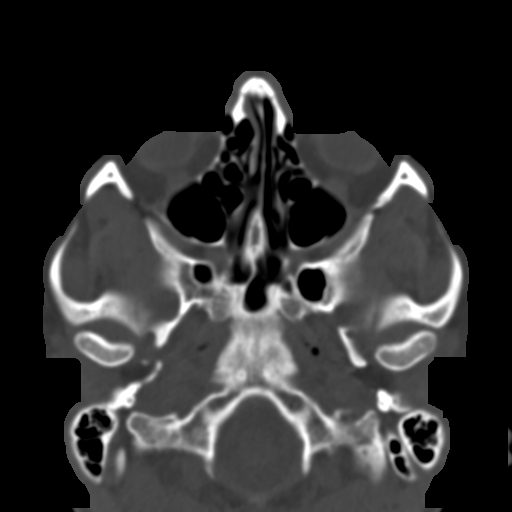
[im 61/81  bone]
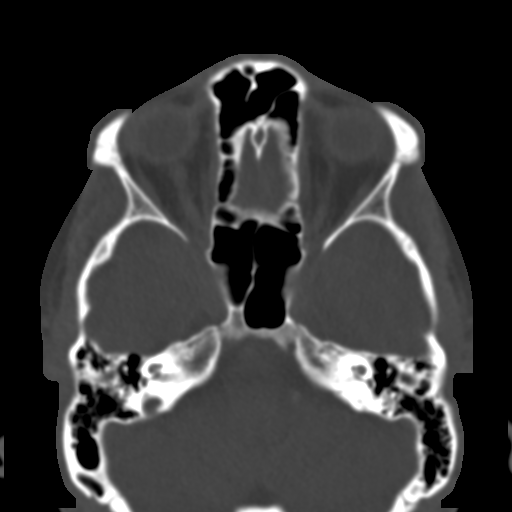
[im 67/81  brain]
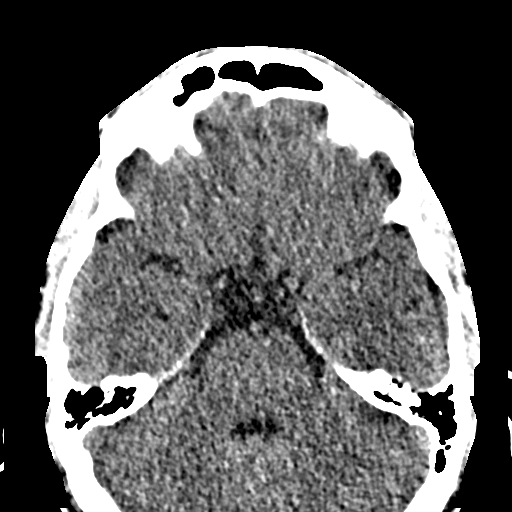
[im 67/81  bone]
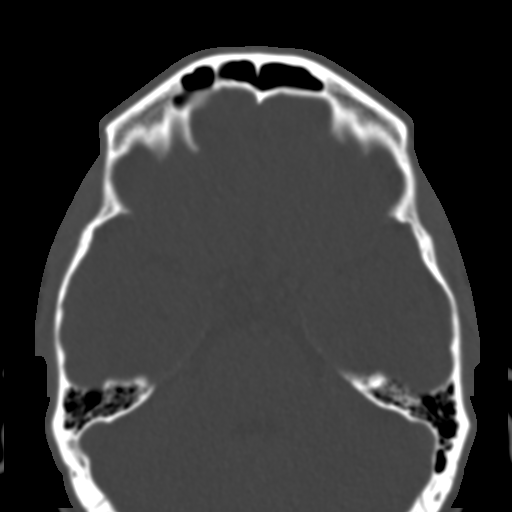
[im 75/81  bone]
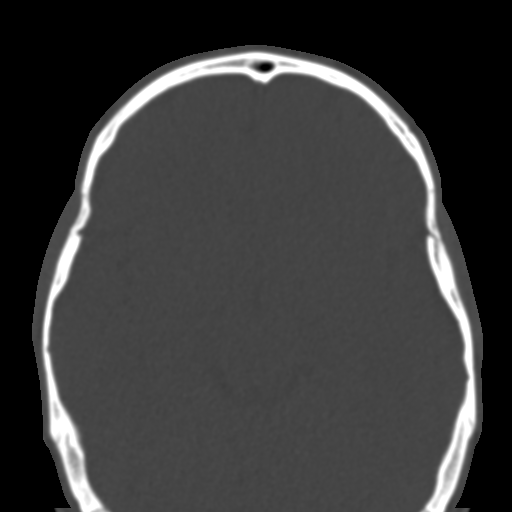

[Series 6: coronal soft · coronal · 0.32mm/px · 3 of 74 slices shown]
[im 33/74  bone]
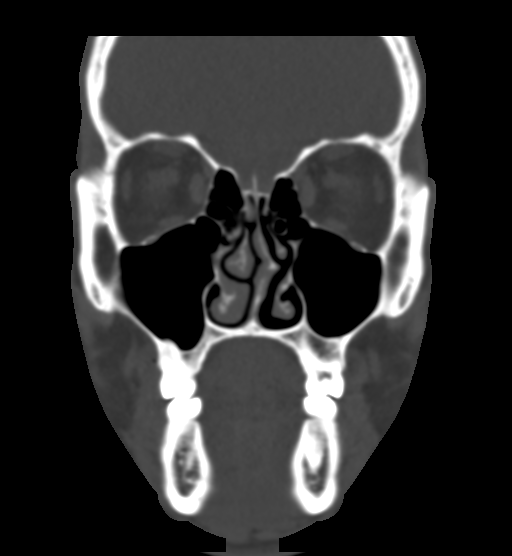
[im 41/74  bone]
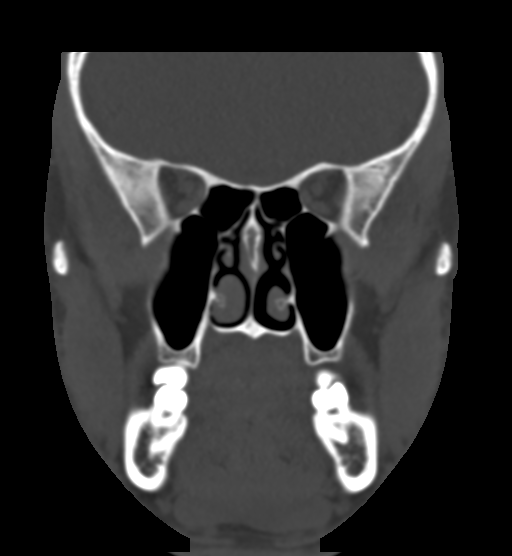
[im 49/74  bone]
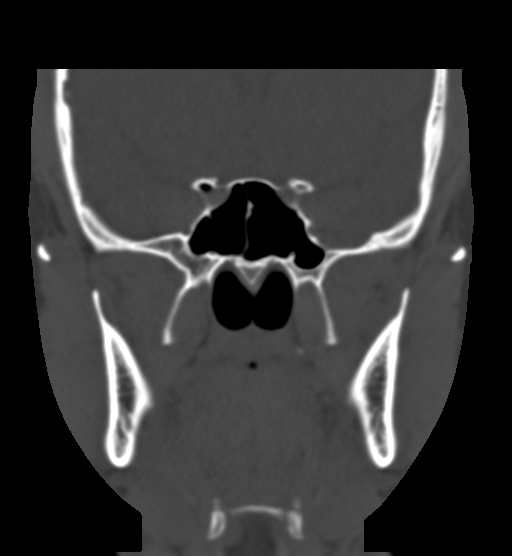

[Series 7: sagittal soft · sagittal · 0.29mm/px · 3 of 82 slices shown]
[im 28/82  bone]
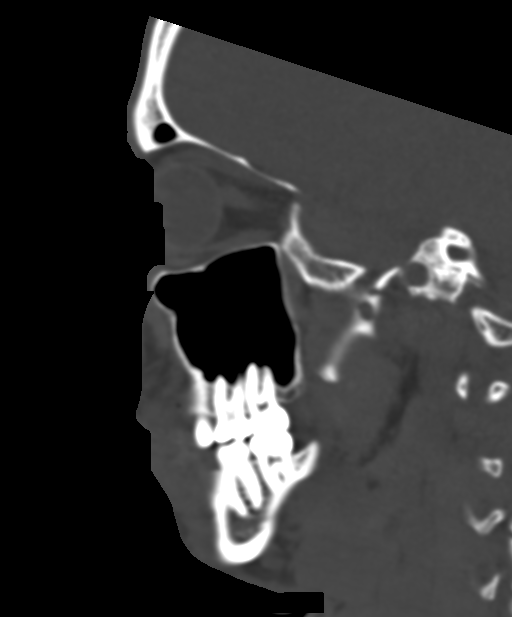
[im 41/82  bone]
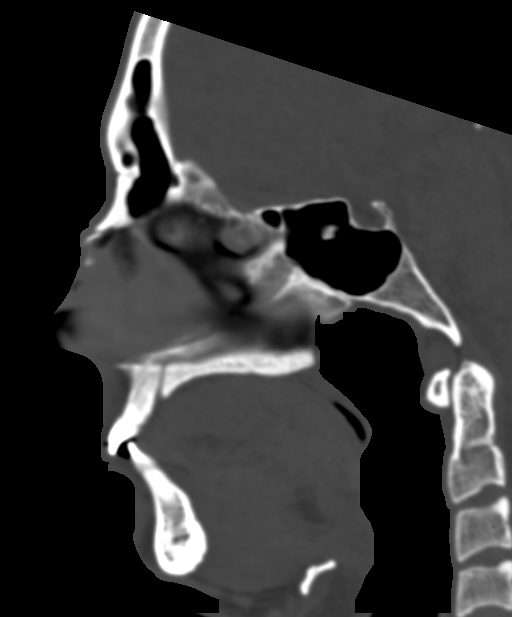
[im 55/82  bone]
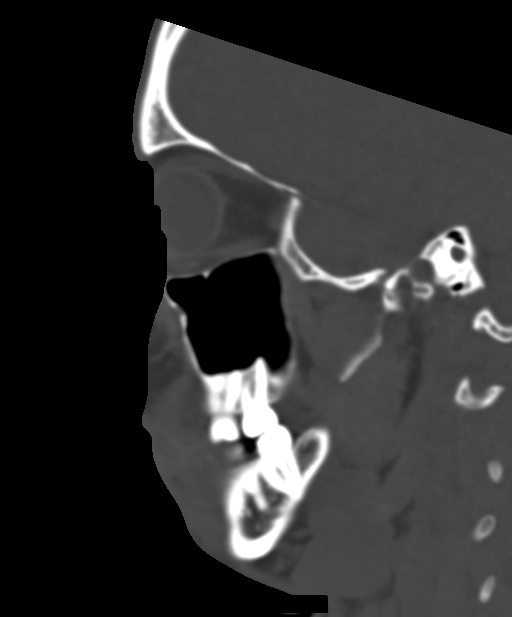

[16 of 47 positions shown; findings below may reference images not displayed]

FINDINGS: Osseous: Normal positioning of the mandibular heads. No mandibular
fracture is seen. Pterygoid plates and zygomatic arches appear
intact. Acute, slightly comminuted and displaced bilateral nasal
bone fracture.

Orbits: Negative. No traumatic or inflammatory finding.

Sinuses: Clear.

Soft tissues: Soft tissue swelling over the nasal area and low
forehead.

Limited intracranial: No significant or unexpected finding.
IMPRESSION: Acute slightly comminuted and displaced bilateral nasal bone
fractures. Associated soft tissue swelling. No other acute facial
bone fractures are seen.

## 2018-05-14 NOTE — Progress Notes (Signed)
Patient presents today with symptoms of nasal congestion (noncolored)for the last few days. Admits to nose bleed on two occasions. It stopped within a few minutes with pressure. Denies any acute injury to the nose. Has hx of deviated septum and decreased breathing out of left nostril usually. Denies fever, cough, ear pain, sore throat. Denies any illicit drug use. Has not taken any medications   ROS: Negative except mentioned above.  GENERAL: NAD HEENT: deviated septum, no pharyngeal erythema, no exudate, no erythema of TMs, mild erythema of nasal mucosa bilaterally, minimal dry blood noted in left nostril, minimal bilateral maxillary sinus tenderness   RESP: CTA B CARD: RRR NEURO: CN II-XII grossly intact   A/P: Nasal Congestion, Epistaxis - can try oral antihistamine/decongestant for nasal congestion, but should try some Vaseline in nostrils for a few days, encouraged not to blow nose or nose pick. Patient is going to have surgery for deviated septum after soccer is over. If continues to have above problems will refer to ENT.
